# Patient Record
Sex: Female | Born: 1950 | Race: White | Hispanic: No | Marital: Married | Smoking: Never smoker
Health system: Southern US, Community
[De-identification: ages and names within clinical notes are randomized; demographics above are authoritative.]

## PROBLEM LIST (undated history)

## (undated) DIAGNOSIS — I10 Essential (primary) hypertension: Secondary | ICD-10-CM

## (undated) HISTORY — PX: ABDOMINAL HYSTERECTOMY: SHX81

---

## 2006-06-25 ENCOUNTER — Emergency Department: Payer: Self-pay | Admitting: Emergency Medicine

## 2008-12-25 ENCOUNTER — Ambulatory Visit: Payer: Self-pay | Admitting: Internal Medicine

## 2010-03-16 ENCOUNTER — Ambulatory Visit: Payer: Self-pay | Admitting: Internal Medicine

## 2011-11-13 ENCOUNTER — Ambulatory Visit: Payer: Self-pay | Admitting: Internal Medicine

## 2012-10-02 ENCOUNTER — Ambulatory Visit: Payer: Self-pay | Admitting: Internal Medicine

## 2013-12-02 ENCOUNTER — Ambulatory Visit: Payer: Self-pay | Admitting: Ophthalmology

## 2013-12-08 ENCOUNTER — Ambulatory Visit: Payer: Self-pay | Admitting: Internal Medicine

## 2013-12-11 ENCOUNTER — Ambulatory Visit: Payer: Self-pay | Admitting: Vascular Surgery

## 2013-12-11 LAB — CREATININE, SERUM
CREATININE: 0.52 mg/dL — AB (ref 0.60–1.30)
EGFR (African American): >60
EGFR (Non-African Amer.): >60

## 2015-02-17 ENCOUNTER — Ambulatory Visit
Admission: RE | Admit: 2015-02-17 | Discharge: 2015-02-17 | Disposition: A | Discharge status: Home / Self Care | Payer: BC Managed Care – PPO | Source: Ambulatory Visit | Attending: Internal Medicine | Admitting: Internal Medicine

## 2015-02-17 ENCOUNTER — Other Ambulatory Visit: Payer: Self-pay | Admitting: Internal Medicine

## 2015-02-17 DIAGNOSIS — M25561 Pain in right knee: Secondary | ICD-10-CM | POA: Diagnosis present

## 2015-02-17 DIAGNOSIS — R52 Pain, unspecified: Secondary | ICD-10-CM

## 2015-11-03 ENCOUNTER — Ambulatory Visit: Payer: Self-pay | Admitting: General Surgery

## 2015-12-02 ENCOUNTER — Encounter: Payer: Self-pay | Admitting: *Deleted

## 2016-06-06 ENCOUNTER — Emergency Department: Payer: BC Managed Care – PPO

## 2016-06-06 ENCOUNTER — Emergency Department
Admission: EM | Admit: 2016-06-06 | Discharge: 2016-06-06 | Disposition: A | Discharge status: Home / Self Care | Payer: BC Managed Care – PPO | Attending: Emergency Medicine | Admitting: Emergency Medicine

## 2016-06-06 ENCOUNTER — Encounter: Payer: Self-pay | Admitting: Emergency Medicine

## 2016-06-06 DIAGNOSIS — B029 Zoster without complications: Secondary | ICD-10-CM | POA: Diagnosis not present

## 2016-06-06 DIAGNOSIS — R1010 Upper abdominal pain, unspecified: Secondary | ICD-10-CM | POA: Insufficient documentation

## 2016-06-06 DIAGNOSIS — I10 Essential (primary) hypertension: Secondary | ICD-10-CM | POA: Diagnosis not present

## 2016-06-06 DIAGNOSIS — R52 Pain, unspecified: Secondary | ICD-10-CM

## 2016-06-06 DIAGNOSIS — R0781 Pleurodynia: Secondary | ICD-10-CM | POA: Diagnosis present

## 2016-06-06 HISTORY — DX: Essential (primary) hypertension: I10

## 2016-06-06 LAB — URINALYSIS, COMPLETE (UACMP) WITH MICROSCOPIC
BACTERIA UA: NONE SEEN
BILIRUBIN URINE: NEGATIVE
Glucose, UA: NEGATIVE mg/dL
Hgb urine dipstick: NEGATIVE
KETONES UR: NEGATIVE mg/dL
LEUKOCYTES UA: NEGATIVE
Nitrite: NEGATIVE
PH: 8 (ref 5.0–8.0)
Protein, ur: NEGATIVE mg/dL
SPECIFIC GRAVITY, URINE: 1.003 — AB (ref 1.005–1.030)

## 2016-06-06 LAB — DIFFERENTIAL
BASOS ABS: 0 10*3/uL (ref 0–0.1)
BASOS PCT: 0 %
EOS ABS: 0 10*3/uL (ref 0–0.7)
Eosinophils Relative: 0 %
LYMPHS ABS: 3.6 10*3/uL (ref 1.0–3.6)
Lymphocytes Relative: 29 %
MONOS PCT: 4 %
Monocytes Absolute: 0.5 10*3/uL (ref 0.2–0.9)
NEUTROS ABS: 8 10*3/uL — AB (ref 1.4–6.5)
Neutrophils Relative %: 67 %

## 2016-06-06 LAB — HEPATIC FUNCTION PANEL
ALK PHOS: 49 U/L (ref 38–126)
ALT: 23 U/L (ref 14–54)
AST: 21 U/L (ref 15–41)
Albumin: 4.2 g/dL (ref 3.5–5.0)
BILIRUBIN TOTAL: 0.4 mg/dL (ref 0.3–1.2)
Total Protein: 7.2 g/dL (ref 6.5–8.1)

## 2016-06-06 LAB — CBC
HEMATOCRIT: 42.6 % (ref 35.0–47.0)
HEMOGLOBIN: 14.9 g/dL (ref 12.0–16.0)
MCH: 29.7 pg (ref 26.0–34.0)
MCHC: 35 g/dL (ref 32.0–36.0)
MCV: 84.7 fL (ref 80.0–100.0)
Platelets: 424 10*3/uL (ref 150–440)
RBC: 5.03 MIL/uL (ref 3.80–5.20)
RDW: 12.9 % (ref 11.5–14.5)
WBC: 12.2 10*3/uL — ABNORMAL HIGH (ref 3.6–11.0)

## 2016-06-06 LAB — BASIC METABOLIC PANEL
ANION GAP: 10 (ref 5–15)
BUN: 20 mg/dL (ref 6–20)
CALCIUM: 9.5 mg/dL (ref 8.9–10.3)
CHLORIDE: 99 mmol/L — AB (ref 101–111)
CO2: 26 mmol/L (ref 22–32)
Creatinine, Ser: 0.58 mg/dL (ref 0.44–1.00)
GFR calc non Af Amer: >60 mL/min (ref >60)
GLUCOSE: 94 mg/dL (ref 65–99)
Potassium: 3.2 mmol/L — ABNORMAL LOW (ref 3.5–5.1)
Sodium: 135 mmol/L (ref 135–145)

## 2016-06-06 LAB — TROPONIN I: Troponin I: <0.03 ng/mL (ref <0.03)

## 2016-06-06 MED ORDER — HYDROMORPHONE HCL 1 MG/ML IJ SOLN
0.5000 mg | Freq: Once | INTRAMUSCULAR | Status: AC
  Administered 2016-06-06: 0.5 mg via INTRAVENOUS

## 2016-06-06 MED ORDER — VALACYCLOVIR HCL 1 G PO TABS: 1000.0000 mg | ORAL_TABLET | Freq: Three times a day (TID) | ORAL | 0 refills | Status: AC

## 2016-06-06 MED ORDER — ONDANSETRON HCL 4 MG/2ML IJ SOLN
INTRAMUSCULAR | Status: AC
  Administered 2016-06-06: 4 mg via INTRAVENOUS
  Filled 2016-06-06: qty 2

## 2016-06-06 MED ORDER — VALACYCLOVIR HCL 500 MG PO TABS
1000.0000 mg | ORAL_TABLET | Freq: Once | ORAL | Status: AC
  Administered 2016-06-06: 1000 mg via ORAL
  Filled 2016-06-06: qty 2

## 2016-06-06 MED ORDER — MORPHINE SULFATE (PF) 4 MG/ML IV SOLN
INTRAVENOUS | Status: AC
  Filled 2016-06-06: qty 1

## 2016-06-06 MED ORDER — OXYCODONE-ACETAMINOPHEN 5-325 MG PO TABS: 1.0000 | ORAL_TABLET | Freq: Four times a day (QID) | ORAL | 0 refills | Status: AC | PRN

## 2016-06-06 MED ORDER — ONDANSETRON HCL 4 MG/2ML IJ SOLN
4.0000 mg | Freq: Once | INTRAMUSCULAR | Status: AC
  Administered 2016-06-06: 4 mg via INTRAVENOUS

## 2016-06-06 MED ORDER — LEVALBUTEROL HCL 1.25 MG/3ML IN NEBU: 1.2500 mg | INHALATION_SOLUTION | Freq: Once | RESPIRATORY_TRACT | Status: DC

## 2016-06-06 MED ORDER — HYDROMORPHONE HCL 1 MG/ML IJ SOLN
INTRAMUSCULAR | Status: AC
  Administered 2016-06-06: 0.5 mg via INTRAVENOUS
  Filled 2016-06-06: qty 1

## 2016-06-06 NOTE — ED Notes (Signed)
Unhooked pt from monitor to use the bathroom.  

## 2016-06-06 NOTE — ED Notes (Signed)
Patient transported to CT scan.  Will continue to monitor.

## 2016-06-06 NOTE — ED Notes (Signed)
Hooked pt back up to the monitor.

## 2016-06-06 NOTE — Discharge Instructions (Signed)
Please take the Valtrex one pill 3 times a day. Use the Percocet if needed for pain. You can take 1 pill 4 times a day if one pill was not enough weight about an hour from taking the first pill and he can take a second pill. If you need to pills then take the 2 pills together 4 times a day after that. Careful the Percocet can make you sleepy and constipated. Don't fall and break anything. Your contagious and can give people with chickenpox until all the lesions are scabbed. Please do not go around any pregnant women or young babies.

## 2016-06-06 NOTE — ED Notes (Signed)
Pt verbalized understanding of DC instructions, prescriptions and follow up. Pt and husband had no questions at this time. Pt states they are going to pharmacy to get prescriptions filled.

## 2016-06-06 NOTE — ED Notes (Signed)
Brought patient and husband diet coke at this time.

## 2016-06-06 NOTE — ED Provider Notes (Signed)
[facility] Emergency Department Provider Note   ____________________________________________   First MD Initiated Contact with Patient 06/06/16 [phone]     (approximate)  I have reviewed the triage vital signs and the nursing notes.   HISTORY  Chief Complaint Back Pain    HPI Susan Lane is a 65 y.o. female patient reports 7 days ago she began experiencing pain at the bottom of the lowest rib on the left side of her back in the CVA area. This pain has progressed to being in a bandlike distribution around the left side of her chest from the back around towards the front of her abdomen. The skin is somewhat more sensitive and that band area which is about 2 inches wide and there is some itching that occurred today. There is no rash however she reports the pain went away this past Friday for 24 hours and then returned again. The pain does not radiate anywhere except for around the front in a horizontal band. She has no fever nausea vomiting or any other complaints. The pain is gotten worse and is now keeping her from sleeping. She has not had a fever. Nothing seems to make the pain better or worse. She was taking some Vicodin she had for a toothache.   Past Medical History:  Diagnosis Date  . Hypertension     There are no active problems to display for this patient.   Past Surgical History:  Procedure Laterality Date  . ABDOMINAL HYSTERECTOMY    . CESAREAN SECTION      Prior to Admission medications   Medication Sig Start Date End Date Taking? Authorizing Provider  oxyCODONE-acetaminophen (ROXICET) 5-325 MG tablet Take 1 tablet by mouth every 6 (six) hours as needed. Take 1-2 pills 4 x a day as needed for pain 06/06/16 06/06/17  Arnaldo NatalPaul F Jaydeen Darley, MD  valACYclovir (VALTREX) 1000 MG tablet Take 1 tablet (1,000 mg total) by mouth 3 (three) times daily. 06/06/16 06/13/16  Arnaldo NatalPaul F Kryslyn Helbig, MD    Allergies Morphine and related and Sulfa antibiotics  No  family history on file.  Social History Social History  Substance Use Topics  . Smoking status: Never Smoker  . Smokeless tobacco: Never Used  . Alcohol use No    Review of Systems Constitutional: No fever/chills Eyes: No visual changes. ENT: No sore throat. Cardiovascular: Denies chest pain. Respiratory: Denies shortness of breath. Gastrointestinal:The history of present illness. Genitourinary: Negative for dysuria. Musculoskeletal: Negative for back pain. Skin: Negative for rash. Neurological: Negative for headaches, focal weakness or numbness.  10-point ROS otherwise negative.  ____________________________________________   PHYSICAL EXAM:  VITAL SIGNS: ED Triage Vitals [06/06/16 0651]  Enc Vitals Group     BP      Pulse      Resp      Temp      Temp src      SpO2      Weight 173 lb (78.5 kg)     Height 5\' 4"  (1.626 m)     Head Circumference      Peak Flow      Pain Score 5     Pain Loc      Pain Edu?      Excl. in GC?     Constitutional: Alert and oriented. Well appearing and in no acute distress. Eyes: Conjunctivae are normal. PERRL. EOMI. Head: Atraumatic. Nose: No congestion/rhinnorhea. Mouth/Throat: Mucous membranes are moist.  Oropharynx non-erythematous. Neck: No stridor.  Cardiovascular: Normal rate,  regular rhythm. Grossly normal heart sounds.  Good peripheral circulation. Respiratory: Normal respiratory effort.  No retractions. Lungs CTAB. Gastrointestinal: Soft and nontender. No distention. No abdominal bruits. No CVA tenderness. Musculoskeletal: No lower extremity tenderness nor edema.  No joint effusions. Neurologic:  Normal speech and language. No gross focal neurologic deficits are appreciated. No gait instability. Skin:  Skin is warm, dry and intact. No rash noted.Sensation in the skin area as noted in history of present illness. Psychiatric: Mood and affect are normal. Speech and behavior are  normal.  ____________________________________________   LABS (all labs ordered are listed, but only abnormal results are displayed)  Labs Reviewed  BASIC METABOLIC PANEL - Abnormal; Notable for the following:       Result Value   Potassium 3.2 (*)    Chloride 99 (*)    All other components within normal limits  CBC - Abnormal; Notable for the following:    WBC 12.2 (*)    All other components within normal limits  HEPATIC FUNCTION PANEL - Abnormal; Notable for the following:    Bilirubin, Direct <0.1 (*)    All other components within normal limits  URINALYSIS, COMPLETE (UACMP) WITH MICROSCOPIC - Abnormal; Notable for the following:    Color, Urine COLORLESS (*)    APPearance CLEAR (*)    Specific Gravity, Urine 1.003 (*)    Squamous Epithelial / LPF 0-5 (*)    All other components within normal limits  DIFFERENTIAL - Abnormal; Notable for the following:    Neutro Abs 8.0 (*)    All other components within normal limits  TROPONIN I  CBC WITH DIFFERENTIAL/PLATELET   ____________________________________________  EKG  KG read and interpreted by me shows normal sinus rhythm rate of 91 normal axis essentially normal EKG ____________________________________________  RADIOLOGY  Study Result   CLINICAL DATA:  Left-sided chest pain.  Former smoker.  EXAM: CHEST  2 VIEW  COMPARISON:  06/25/2006  FINDINGS: The cardiomediastinal silhouette is within normal limits. The lungs are well inflated and clear. There is no evidence of pleural effusion or pneumothorax. No acute osseous abnormality is identified. Aortic atherosclerosis is noted.  IMPRESSION: 1. No evidence of acute cardiopulmonary process. 2. Aortic atherosclerosis.   Electronically Signed   By: Sebastian Ache M.D.   On: 06/06/2016 08:04     ____________________________________________   PROCEDURES  Procedure(s) performed:   Procedures  Critical Care performed:    ____________________________________________   INITIAL IMPRESSION / ASSESSMENT AND PLAN / ED COURSE  Pertinent labs & imaging results that were available during my care of the patient were reviewed by me and considered in my medical decision making (see chart for details).    Clinical Course   On repeat exam at 2:30 patient now has some very small blisters in the dermatomal distribution in the area of the pain. This appears to be shingles ____________________________________________   FINAL CLINICAL IMPRESSION(S) / ED DIAGNOSES  Final diagnoses:  Pain  Herpes zoster without complication      NEW MEDICATIONS STARTED DURING THIS VISIT:  New Prescriptions   OXYCODONE-ACETAMINOPHEN (ROXICET) 5-325 MG TABLET    Take 1 tablet by mouth every 6 (six) hours as needed. Take 1-2 pills 4 x a day as needed for pain   VALACYCLOVIR (VALTREX) 1000 MG TABLET    Take 1 tablet (1,000 mg total) by mouth 3 (three) times daily.     Note:  This document was prepared using Dragon voice recognition software and may include unintentional dictation errors.  Arnaldo NatalPaul F Messiah Ahr, MD 06/06/16 [phone]

## 2016-06-06 NOTE — ED Triage Notes (Addendum)
Patient ambulatory to triage with steady gait, without difficulty or distress noted; pt reports pain beneath left scapula radiating around to left upper abd since yesterday; denies any accomp symptoms; pt taken to room 25 by Blenda MountsJeanina, RN for EKG and further evaluation

## 2016-06-06 NOTE — ED Notes (Signed)
Dr. Darnelle CatalanMalinda in room to reassess patient and discuss discharge. Will continue to monitor.

## 2017-01-19 IMAGING — CR DG KNEE COMPLETE 4+V*R*
1 series · 4 of 4 positions shown · non-contrast
Comparison: None.

CLINICAL DATA: Chronic right knee pain. Remote history of surgery.
Swelling mainly about the patella. No known injury. Initial
encounter.

EXAM:
RIGHT KNEE - COMPLETE 4+ VIEW

[Series 1: dg knee complete 4 views right · 0.14mm/px · 4 of 4 slices shown]
[im 1/4]
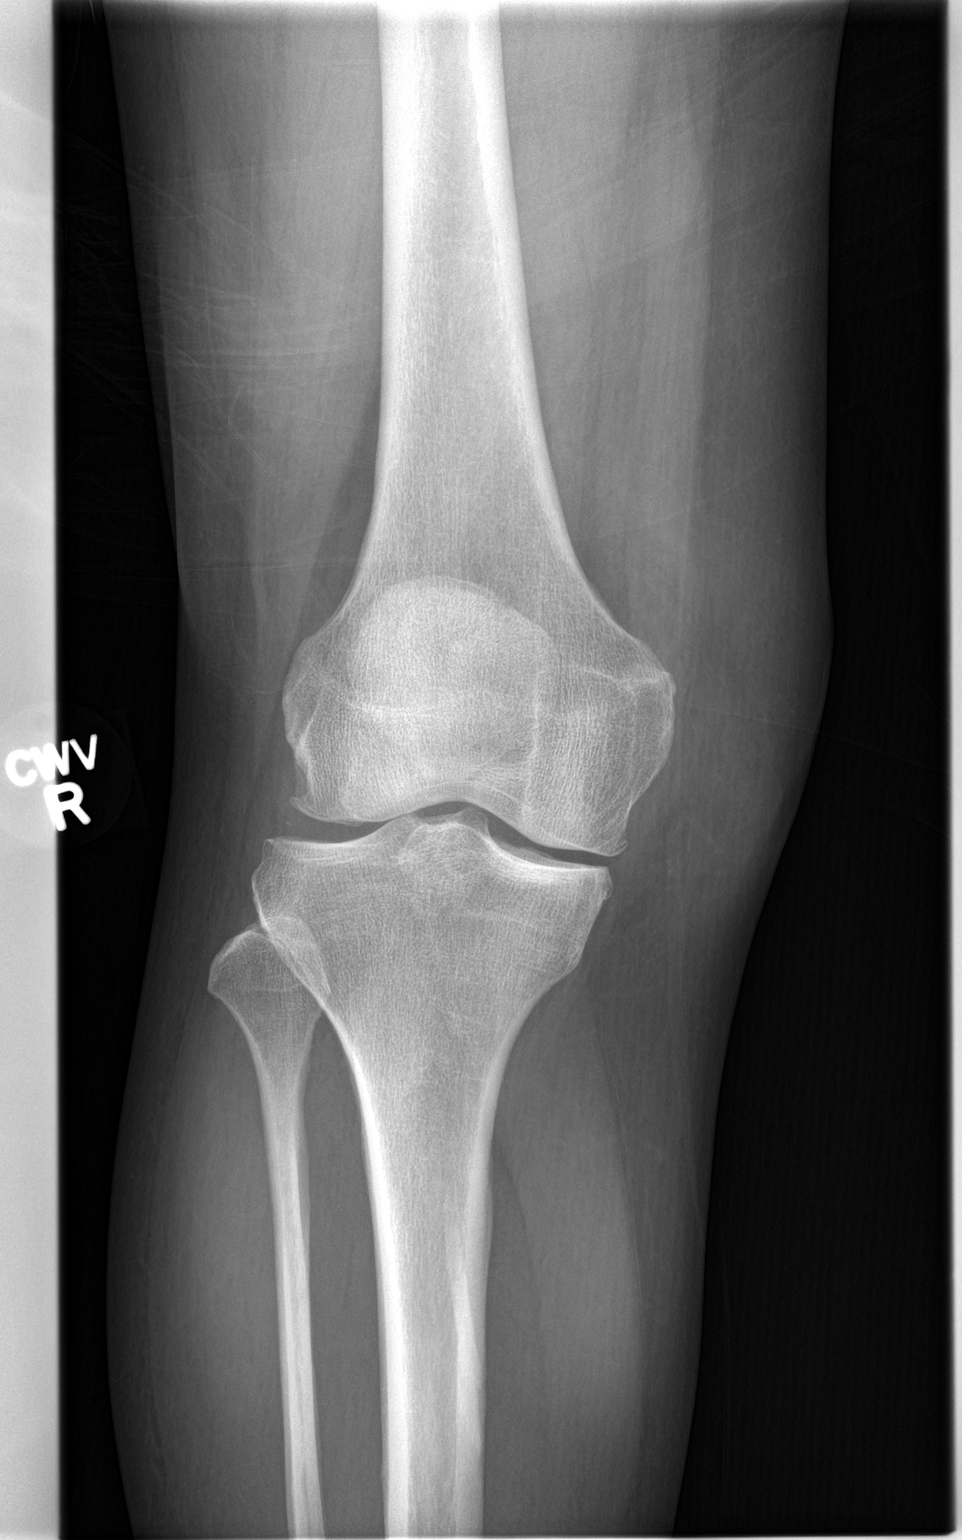
[im 2/4]
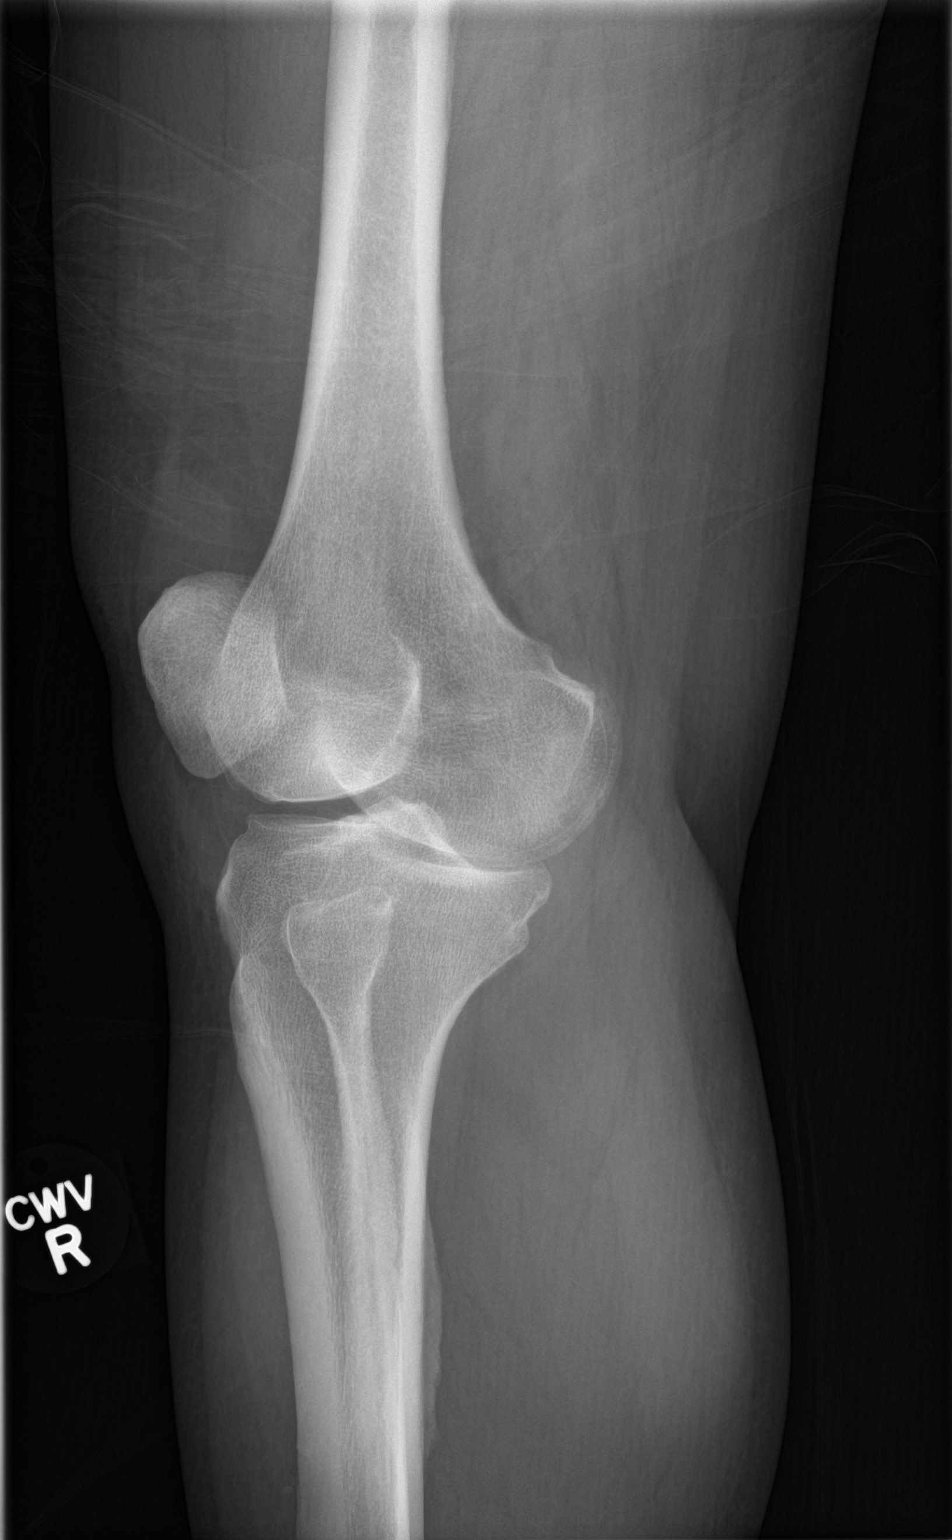
[im 3/4]
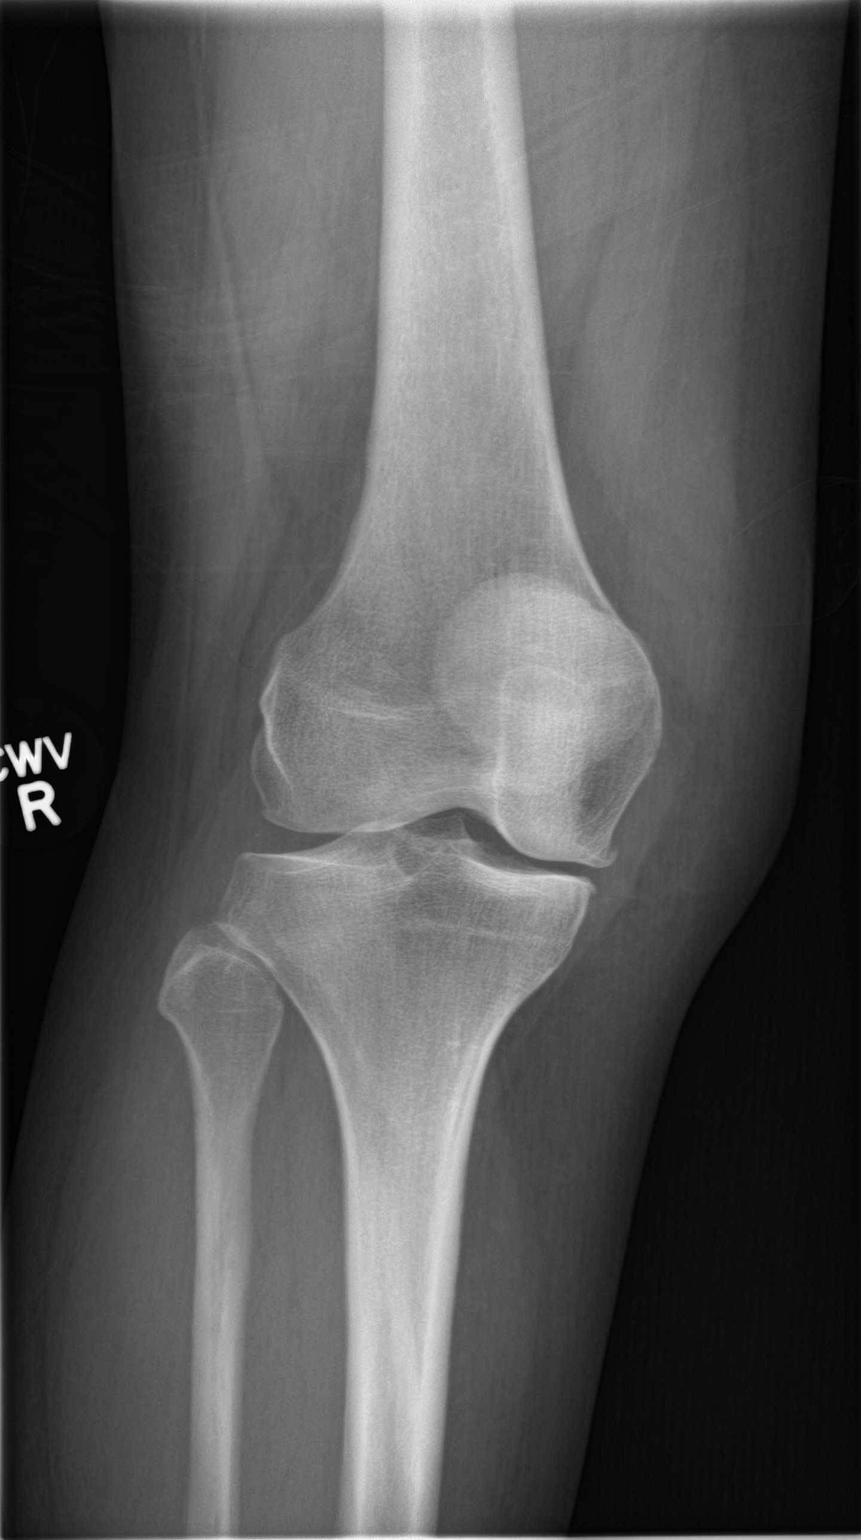
[im 4/4]
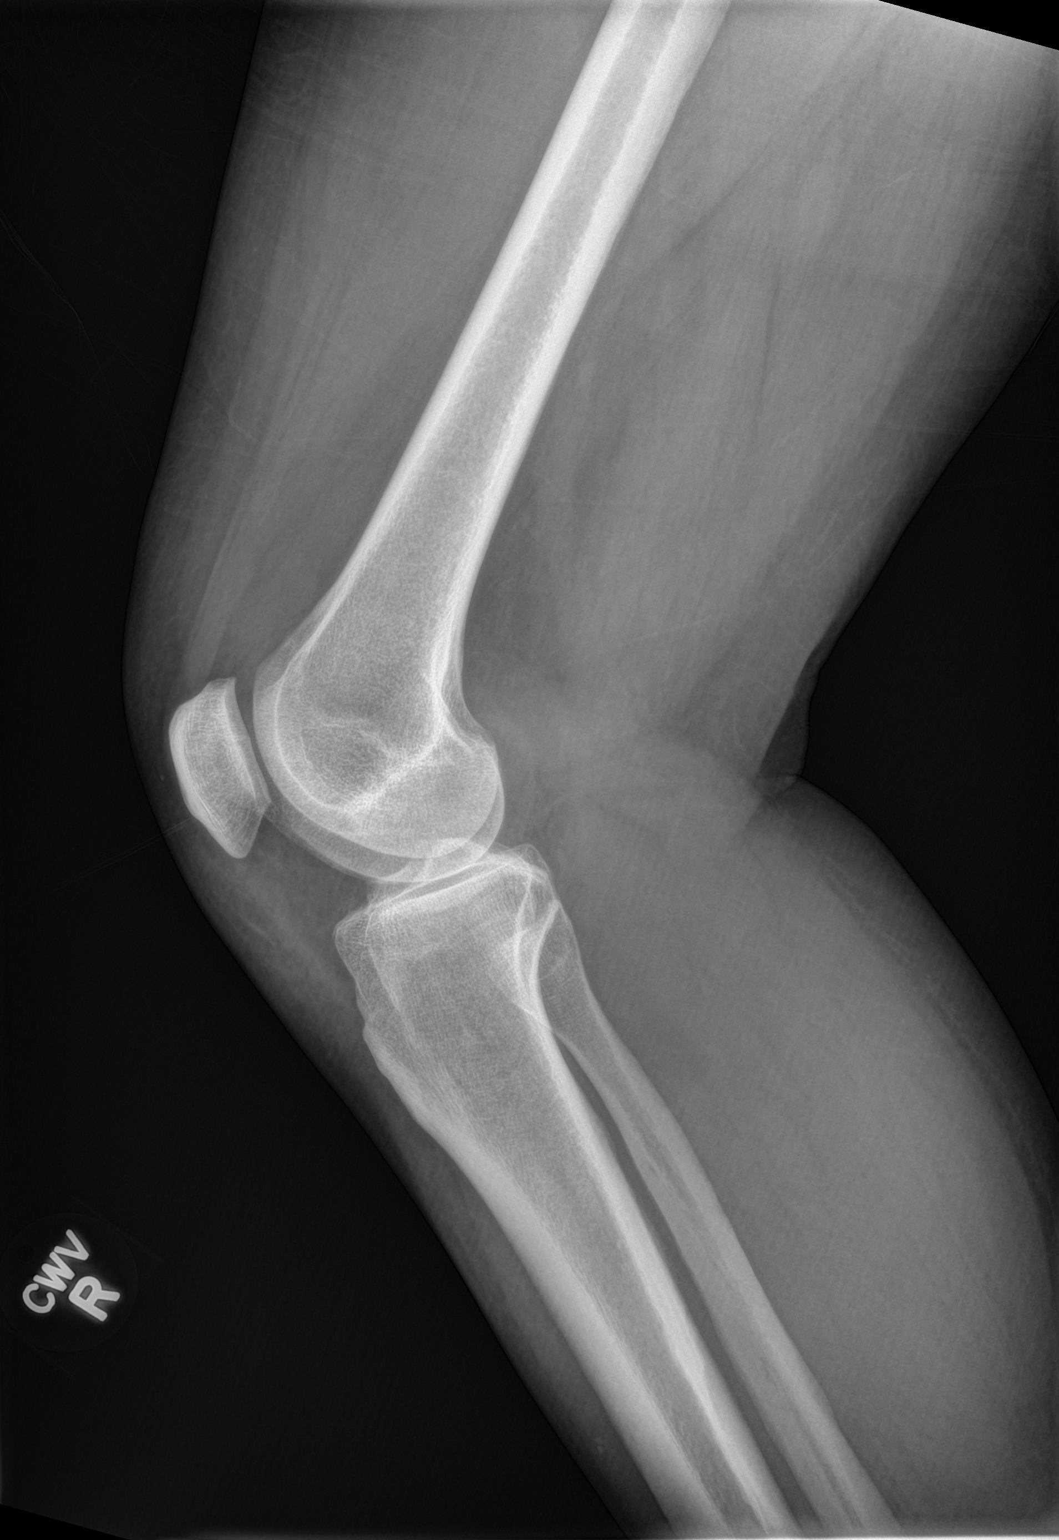

[4 of 4 positions shown; findings below may reference images not displayed]

FINDINGS: No acute bony or joint abnormality is identified. No joint effusion
is seen. Very small osteophytes are present about the knee. Joint
space narrowing is most notable about the medial compartment.
IMPRESSION: No acute abnormality.

Mild appearing degenerative change.

## 2019-06-26 ENCOUNTER — Other Ambulatory Visit: Payer: Self-pay | Admitting: Internal Medicine

## 2019-06-26 DIAGNOSIS — E2839 Other primary ovarian failure: Secondary | ICD-10-CM

## 2019-08-06 ENCOUNTER — Other Ambulatory Visit: Payer: BC Managed Care – PPO
